# Patient Record
Sex: Female | Born: 1935 | Race: White | Hispanic: No
Health system: Southern US, Community
[De-identification: ages and names within clinical notes are randomized; demographics above are authoritative.]

---

## 2005-10-23 ENCOUNTER — Ambulatory Visit: Payer: Self-pay | Admitting: Unknown Physician Specialty

## 2006-03-21 ENCOUNTER — Ambulatory Visit: Payer: Self-pay | Admitting: Unknown Physician Specialty

## 2006-07-23 ENCOUNTER — Other Ambulatory Visit: Payer: Self-pay

## 2006-07-23 ENCOUNTER — Inpatient Hospital Stay: Payer: Self-pay | Admitting: Internal Medicine

## 2006-09-24 ENCOUNTER — Inpatient Hospital Stay: Payer: Self-pay | Admitting: Unknown Physician Specialty

## 2006-09-29 ENCOUNTER — Encounter: Payer: Self-pay | Admitting: Internal Medicine

## 2006-10-12 ENCOUNTER — Encounter: Payer: Self-pay | Admitting: Internal Medicine

## 2007-01-02 ENCOUNTER — Ambulatory Visit: Payer: Self-pay | Admitting: Unknown Physician Specialty

## 2007-01-28 ENCOUNTER — Ambulatory Visit: Payer: Self-pay | Admitting: Unknown Physician Specialty

## 2007-03-26 ENCOUNTER — Other Ambulatory Visit: Payer: Self-pay

## 2007-03-26 ENCOUNTER — Inpatient Hospital Stay: Payer: Self-pay | Admitting: Internal Medicine

## 2007-08-10 ENCOUNTER — Inpatient Hospital Stay: Payer: Self-pay | Admitting: Specialist

## 2007-08-10 ENCOUNTER — Other Ambulatory Visit: Payer: Self-pay

## 2007-11-12 IMAGING — CT CT HEAD WITHOUT CONTRAST
3 of 4 series · 17 of 30 positions shown, 20 images · non-contrast
Comparison: none

REASON FOR EXAM: Confusion
COMMENTS:  LMP: Post-Menopausal

PROCEDURE:     CT  - CT HEAD WITHOUT CONTRAST  - July 23, 2006  [DATE]
RESULT:     Unenhanced, emergent head CT was performed for confusion.

[Series 2: without · axial · non-contrast · 0.39mm/px · z∈[+262,+352]mm · 9 of 24 slices shown, 12 images (1 of 2)]
[im 3/24  brain]
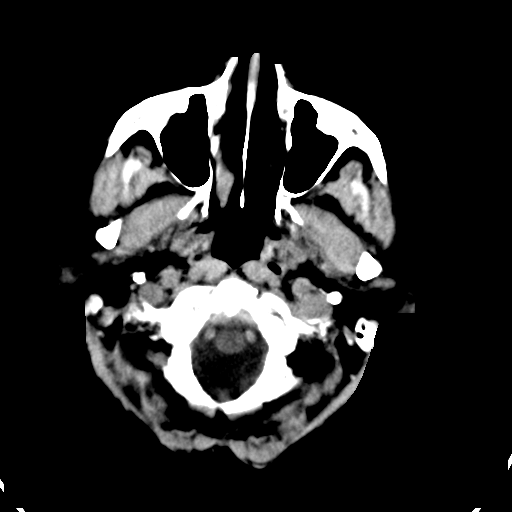
[im 3/24  bone]
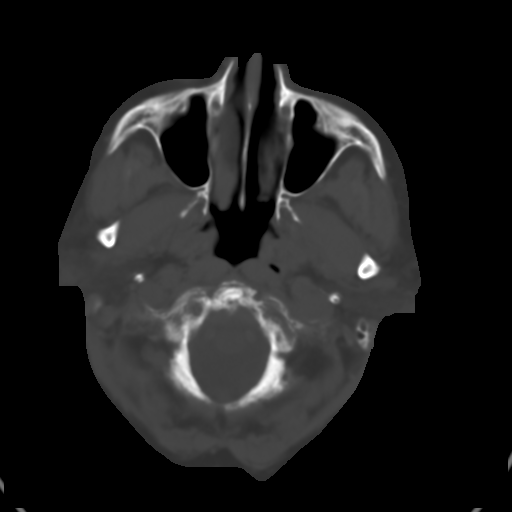
[im 5/24  brain]
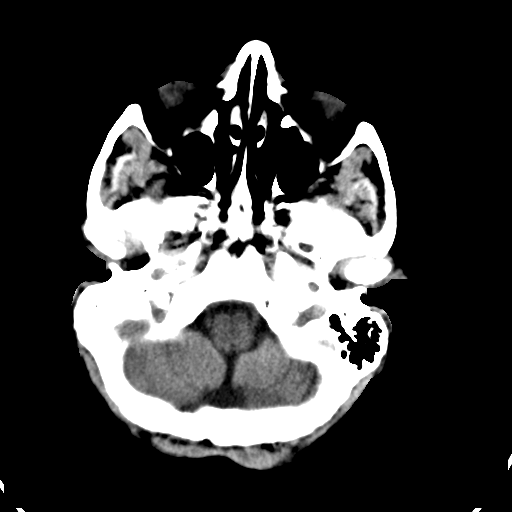
[im 7/24  brain]
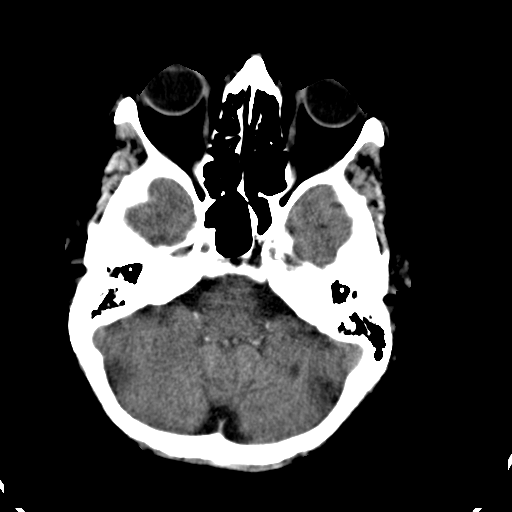
[im 10/24  brain]
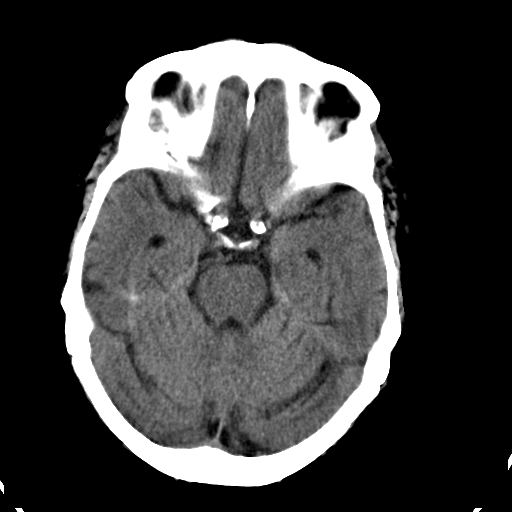
[im 12/24  brain]
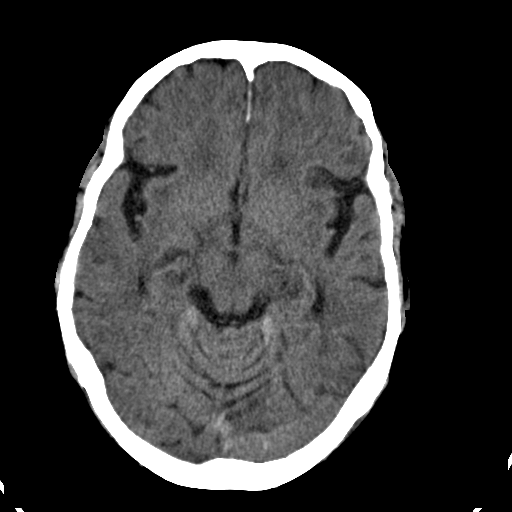
[im 12/24  bone]
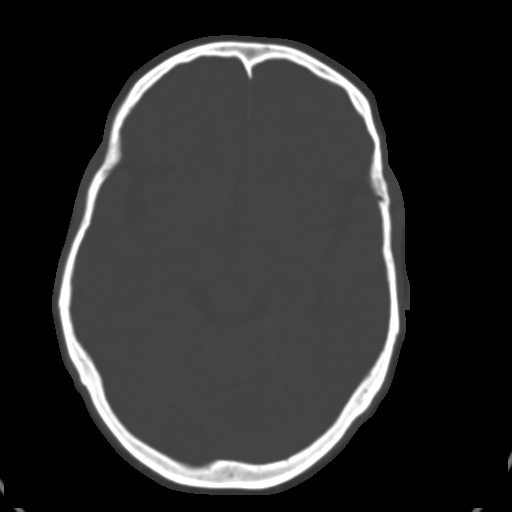
[im 14/24  brain]
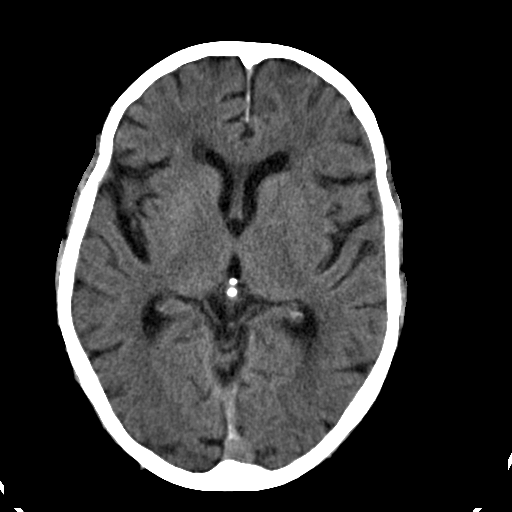
[im 17/24  brain]
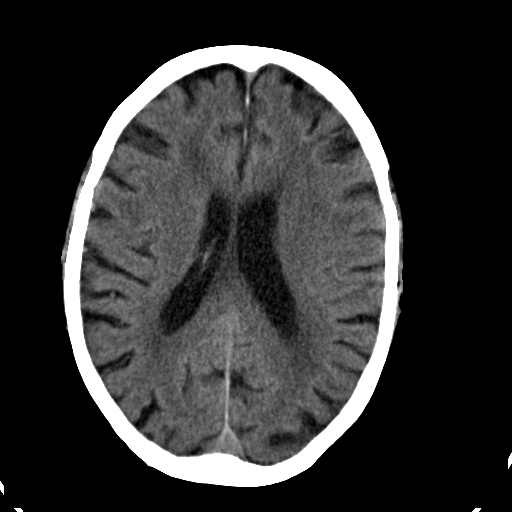
[im 19/24  brain]
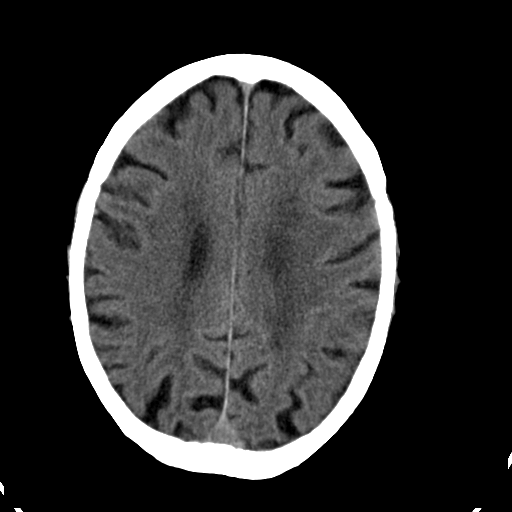
[im 21/24  brain]
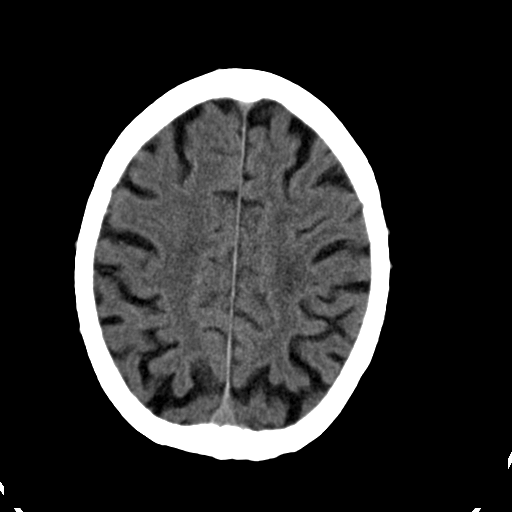
[im 21/24  bone]
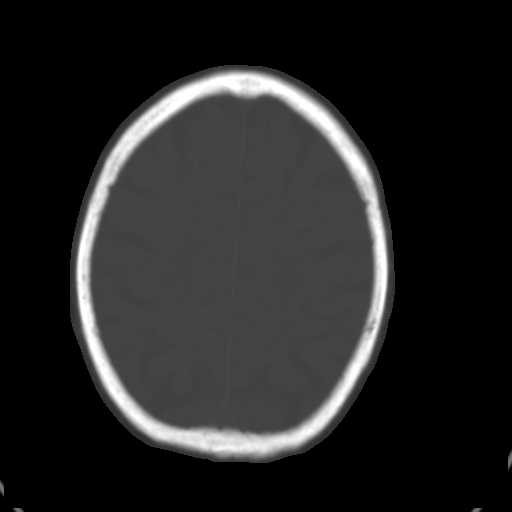

[Series 3: bone · axial · 0.39mm/px · z∈[+262,+327]mm · 6 of 23 slices shown]
[im 3/23  bone]
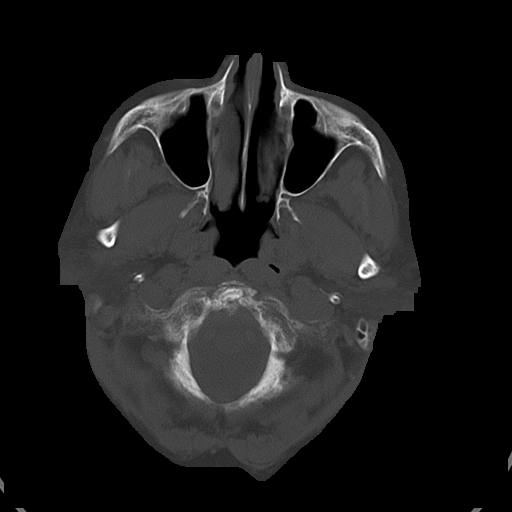
[im 5/23  bone]
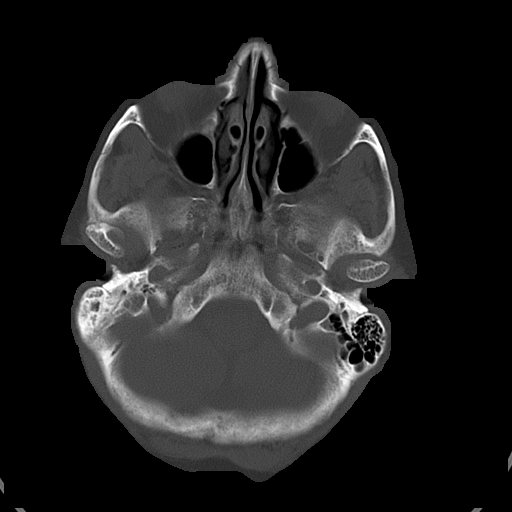
[im 7/23  bone]
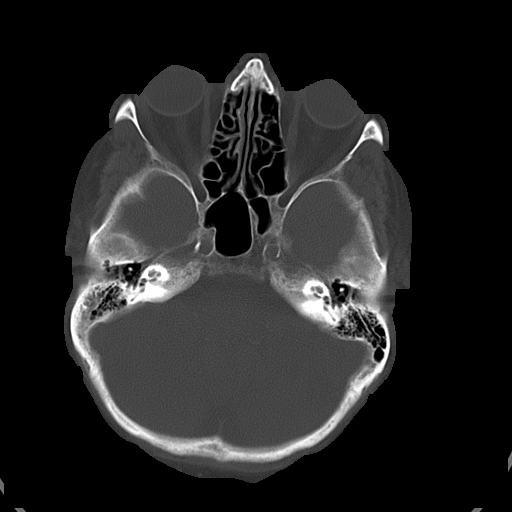
[im 9/23  bone]
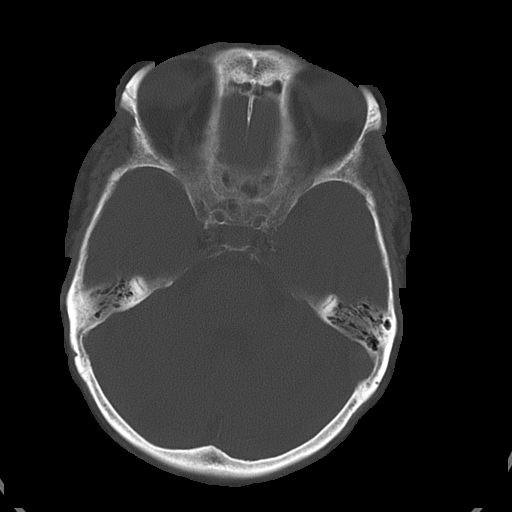
[im 14/23  bone]
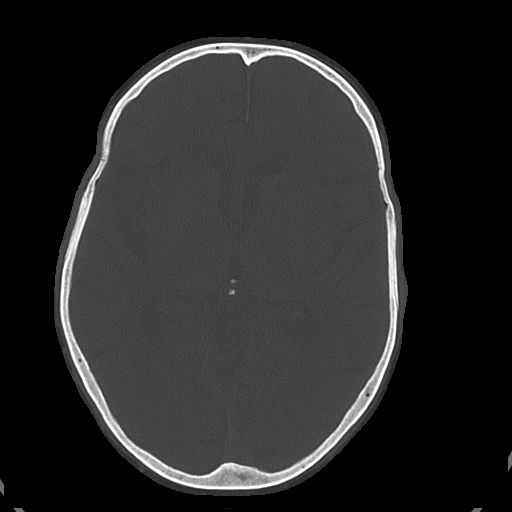
[im 16/23  bone]
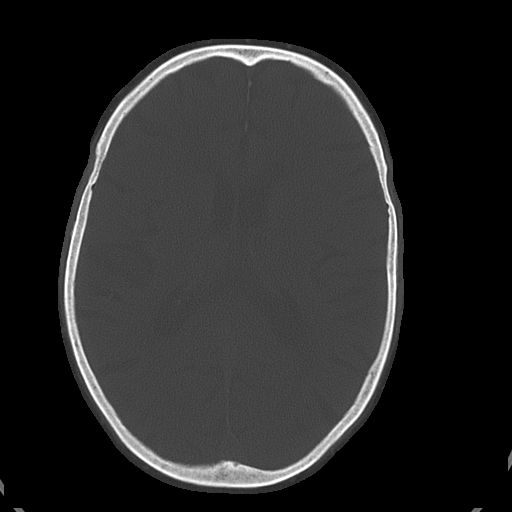

[Series 4: without · axial · non-contrast · 0.39mm/px · z∈[+364,+378]mm · 2 of 9 slices shown (2 of 2)]
[im 3/9  brain]
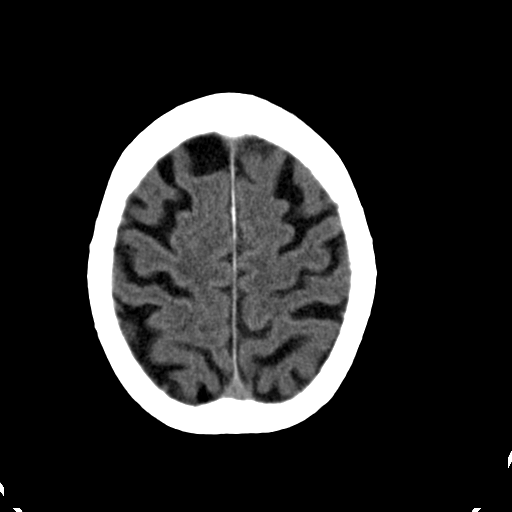
[im 6/9  brain]
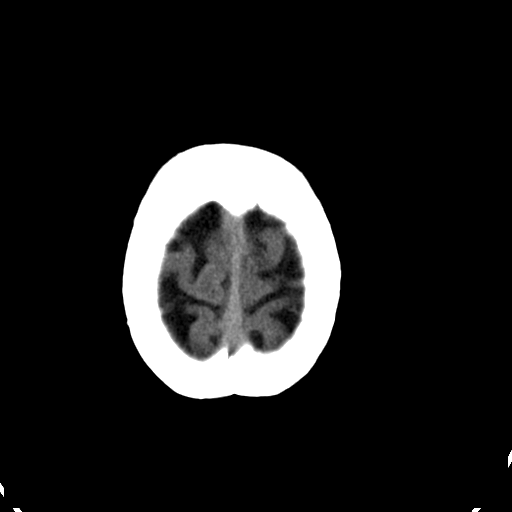

[17 of 30 positions shown; findings below may reference images not displayed]

FINDINGS: There are noted changes of generalized cerebral atrophy with
periventricular small vessel ischemic changes. There is no mass effect or
shift of the midline. No subdural hematomas.

On the bone window settings, no obvious abnormalities are identified. The
sinuses and mastoids appear clear.
IMPRESSION: Changes of atrophy. No acute findings are seen.

The report was called to the Emergency Room at the conclusion of dictation.

## 2008-03-10 ENCOUNTER — Ambulatory Visit: Payer: Self-pay | Admitting: Unknown Physician Specialty

## 2009-06-09 ENCOUNTER — Ambulatory Visit: Payer: Self-pay | Admitting: Unknown Physician Specialty

## 2009-09-05 ENCOUNTER — Ambulatory Visit: Payer: Self-pay | Admitting: Internal Medicine

## 2010-07-13 ENCOUNTER — Ambulatory Visit: Payer: Self-pay | Admitting: Unknown Physician Specialty

## 2010-07-28 ENCOUNTER — Emergency Department: Payer: Self-pay | Admitting: Emergency Medicine

## 2010-08-24 ENCOUNTER — Emergency Department: Payer: Self-pay | Admitting: Emergency Medicine

## 2010-08-25 ENCOUNTER — Inpatient Hospital Stay (HOSPITAL_COMMUNITY): Admission: EM | Admit: 2010-08-25 | Discharge: 2010-08-26 | Payer: Self-pay | Admitting: Emergency Medicine

## 2011-01-25 LAB — GLUCOSE, CAPILLARY: Glucose-Capillary: 305 mg/dL — ABNORMAL HIGH (ref 70–99)

## 2011-08-05 ENCOUNTER — Emergency Department: Payer: Self-pay | Admitting: Emergency Medicine

## 2011-12-23 ENCOUNTER — Emergency Department: Payer: Self-pay | Admitting: Emergency Medicine

## 2011-12-23 LAB — COMPREHENSIVE METABOLIC PANEL
Albumin: 3.7 g/dL (ref 3.4–5.0)
Alkaline Phosphatase: 89 U/L (ref 50–136)
Anion Gap: 7 (ref 7–16)
BUN: 35 mg/dL — ABNORMAL HIGH (ref 7–18)
Glucose: 66 mg/dL (ref 65–99)
Potassium: 3.9 mmol/L (ref 3.5–5.1)
SGOT(AST): 18 U/L (ref 15–37)
Sodium: 141 mmol/L (ref 136–145)
Total Protein: 7.1 g/dL (ref 6.4–8.2)

## 2011-12-23 LAB — CBC
HCT: 34.2 % — ABNORMAL LOW (ref 35.0–47.0)
MCV: 105 fL — ABNORMAL HIGH (ref 80–100)
RDW: 14.4 % (ref 11.5–14.5)
WBC: 11.6 10*3/uL — ABNORMAL HIGH (ref 3.6–11.0)

## 2011-12-23 LAB — TROPONIN I: Troponin-I: <0.02 ng/mL

## 2012-02-06 ENCOUNTER — Emergency Department: Payer: Self-pay | Admitting: *Deleted

## 2012-02-06 LAB — COMPREHENSIVE METABOLIC PANEL
Anion Gap: 11 (ref 7–16)
BUN: 27 mg/dL — ABNORMAL HIGH (ref 7–18)
Bilirubin,Total: 0.3 mg/dL (ref 0.2–1.0)
Calcium, Total: 9.8 mg/dL (ref 8.5–10.1)
Chloride: 110 mmol/L — ABNORMAL HIGH (ref 98–107)
EGFR (African American): >60
Glucose: 70 mg/dL (ref 65–99)
Osmolality: 290 (ref 275–301)
Potassium: 4.9 mmol/L (ref 3.5–5.1)
Sodium: 144 mmol/L (ref 136–145)
Total Protein: 7.5 g/dL (ref 6.4–8.2)

## 2012-02-06 LAB — URINALYSIS, COMPLETE
Nitrite: POSITIVE
Squamous Epithelial: <1
WBC UR: 342 /HPF (ref 0–5)

## 2012-02-06 LAB — TROPONIN I: Troponin-I: <0.02 ng/mL

## 2012-02-06 LAB — CBC
MCH: 34.6 pg — ABNORMAL HIGH (ref 26.0–34.0)
MCHC: 32.7 g/dL (ref 32.0–36.0)
RBC: 3.24 10*6/uL — ABNORMAL LOW (ref 3.80–5.20)
RDW: 13.8 % (ref 11.5–14.5)

## 2012-02-06 LAB — LIPASE, BLOOD: Lipase: 107 U/L (ref 73–393)

## 2012-02-08 LAB — URINE CULTURE

## 2012-04-15 DIAGNOSIS — I10 Essential (primary) hypertension: Secondary | ICD-10-CM

## 2012-04-15 DIAGNOSIS — F0390 Unspecified dementia without behavioral disturbance: Secondary | ICD-10-CM

## 2012-04-15 DIAGNOSIS — I5042 Chronic combined systolic (congestive) and diastolic (congestive) heart failure: Secondary | ICD-10-CM

## 2012-04-15 DIAGNOSIS — IMO0001 Reserved for inherently not codable concepts without codable children: Secondary | ICD-10-CM

## 2012-04-15 DIAGNOSIS — E1165 Type 2 diabetes mellitus with hyperglycemia: Secondary | ICD-10-CM

## 2012-05-02 DIAGNOSIS — E1165 Type 2 diabetes mellitus with hyperglycemia: Secondary | ICD-10-CM

## 2012-05-21 DIAGNOSIS — I10 Essential (primary) hypertension: Secondary | ICD-10-CM

## 2012-05-21 DIAGNOSIS — E1165 Type 2 diabetes mellitus with hyperglycemia: Secondary | ICD-10-CM

## 2012-05-21 DIAGNOSIS — F068 Other specified mental disorders due to known physiological condition: Secondary | ICD-10-CM

## 2012-05-21 DIAGNOSIS — J449 Chronic obstructive pulmonary disease, unspecified: Secondary | ICD-10-CM

## 2012-05-27 DIAGNOSIS — F411 Generalized anxiety disorder: Secondary | ICD-10-CM

## 2012-06-27 DIAGNOSIS — I509 Heart failure, unspecified: Secondary | ICD-10-CM

## 2012-07-15 ENCOUNTER — Inpatient Hospital Stay: Payer: Self-pay | Admitting: Specialist

## 2012-07-15 ENCOUNTER — Telehealth: Payer: Self-pay | Admitting: Internal Medicine

## 2012-07-15 LAB — COMPREHENSIVE METABOLIC PANEL
Albumin: 3.4 g/dL (ref 3.4–5.0)
Alkaline Phosphatase: 121 U/L (ref 50–136)
BUN: 31 mg/dL — ABNORMAL HIGH (ref 7–18)
Bilirubin,Total: 0.4 mg/dL (ref 0.2–1.0)
Calcium, Total: 9.5 mg/dL (ref 8.5–10.1)
Creatinine: 1.02 mg/dL (ref 0.60–1.30)
Glucose: 168 mg/dL — ABNORMAL HIGH (ref 65–99)
Potassium: 4.5 mmol/L (ref 3.5–5.1)
Sodium: 140 mmol/L (ref 136–145)
Total Protein: 7.1 g/dL (ref 6.4–8.2)

## 2012-07-15 LAB — CBC WITH DIFFERENTIAL/PLATELET
Eosinophil %: 0.6 %
Lymphocyte #: 0.9 10*3/uL — ABNORMAL LOW (ref 1.0–3.6)
Lymphocyte %: 6 %
Monocyte %: 9.5 %
Neutrophil %: 83.7 %
Platelet: 210 10*3/uL (ref 150–440)
RBC: 2.91 10*6/uL — ABNORMAL LOW (ref 3.80–5.20)
RDW: 15.6 % — ABNORMAL HIGH (ref 11.5–14.5)
WBC: 15 10*3/uL — ABNORMAL HIGH (ref 3.6–11.0)

## 2012-07-15 LAB — URINALYSIS, COMPLETE
Bilirubin,UR: NEGATIVE
Ketone: NEGATIVE
Protein: NEGATIVE
RBC,UR: 4 /HPF (ref 0–5)
WBC UR: 324 /HPF (ref 0–5)

## 2012-07-15 NOTE — Telephone Encounter (Signed)
Will review status when I am at [facility] this afternoon

## 2012-07-15 NOTE — Telephone Encounter (Signed)
Triage Record Num: [id number] Operator: Edgar Frisk Patient Name: Lisa Horn Call Date & Time: 07/15/2012 4:41:11AM Patient Phone: [phone] PCP: Tillman Abide Patient Gender: Female PCP Fax : [phone] Patient DOB: [birth year] [facility] Name[facility] Reason for Call: Caller: Elizabeth/RN; PCP: Tillman Abide (Family Practice); CB#: [phone]; Call regarding SOB; Nurse reports pt had sudden onset SOB, Resp labored. Temp 99 Ax. Color dusky. O2 sat 83% Advised 911 now Protocol(s) Used: Breathing Problems Recommended Outcome per Protocol: Activate EMS 911 Reason for Outcome: Sudden onset of severe breathing difficulty Care Advice: Write down provider's name. List or place the following in a bag for transport with the patient: current prescription and/or nonprescription medications; alternative treatments, therapies and medications; and street drugs. ~ 07/15/2012 4:48:31AM Page 1 of 1 CAN_TriageRpt_V2

## 2012-07-16 LAB — COMPREHENSIVE METABOLIC PANEL
Anion Gap: 6 — ABNORMAL LOW (ref 7–16)
BUN: 38 mg/dL — ABNORMAL HIGH (ref 7–18)
Bilirubin,Total: 0.5 mg/dL (ref 0.2–1.0)
Chloride: 108 mmol/L — ABNORMAL HIGH (ref 98–107)
Co2: 28 mmol/L (ref 21–32)
Creatinine: 1.17 mg/dL (ref 0.60–1.30)
EGFR (African American): 48 — ABNORMAL LOW
EGFR (Non-African Amer.): 41 — ABNORMAL LOW
Potassium: 4.7 mmol/L (ref 3.5–5.1)
SGPT (ALT): 26 U/L (ref 12–78)
Total Protein: 6.4 g/dL (ref 6.4–8.2)

## 2012-07-16 LAB — CBC WITH DIFFERENTIAL/PLATELET
Basophil %: 0.4 %
HCT: 28.8 % — ABNORMAL LOW (ref 35.0–47.0)
HGB: 9.2 g/dL — ABNORMAL LOW (ref 12.0–16.0)
Lymphocyte %: 7.4 %
MCHC: 32 g/dL (ref 32.0–36.0)
Monocyte %: 8.4 %
Neutrophil #: 12.9 10*3/uL — ABNORMAL HIGH (ref 1.4–6.5)
WBC: 15.3 10*3/uL — ABNORMAL HIGH (ref 3.6–11.0)

## 2012-07-18 DIAGNOSIS — I10 Essential (primary) hypertension: Secondary | ICD-10-CM

## 2012-07-18 DIAGNOSIS — R4182 Altered mental status, unspecified: Secondary | ICD-10-CM

## 2012-07-18 DIAGNOSIS — E1165 Type 2 diabetes mellitus with hyperglycemia: Secondary | ICD-10-CM

## 2012-07-18 DIAGNOSIS — I509 Heart failure, unspecified: Secondary | ICD-10-CM

## 2012-08-18 DIAGNOSIS — F411 Generalized anxiety disorder: Secondary | ICD-10-CM

## 2012-08-18 DIAGNOSIS — E119 Type 2 diabetes mellitus without complications: Secondary | ICD-10-CM

## 2012-08-18 DIAGNOSIS — G479 Sleep disorder, unspecified: Secondary | ICD-10-CM

## 2012-08-18 DIAGNOSIS — F068 Other specified mental disorders due to known physiological condition: Secondary | ICD-10-CM

## 2012-08-19 DIAGNOSIS — L0291 Cutaneous abscess, unspecified: Secondary | ICD-10-CM

## 2012-08-19 DIAGNOSIS — L0390 Cellulitis, unspecified: Secondary | ICD-10-CM

## 2012-08-20 ENCOUNTER — Ambulatory Visit: Payer: Self-pay | Admitting: Internal Medicine

## 2012-09-03 DIAGNOSIS — J449 Chronic obstructive pulmonary disease, unspecified: Secondary | ICD-10-CM

## 2012-09-19 DIAGNOSIS — E1165 Type 2 diabetes mellitus with hyperglycemia: Secondary | ICD-10-CM

## 2012-09-19 DIAGNOSIS — J449 Chronic obstructive pulmonary disease, unspecified: Secondary | ICD-10-CM

## 2012-09-19 DIAGNOSIS — I509 Heart failure, unspecified: Secondary | ICD-10-CM

## 2012-09-19 DIAGNOSIS — F068 Other specified mental disorders due to known physiological condition: Secondary | ICD-10-CM

## 2012-11-24 DIAGNOSIS — E119 Type 2 diabetes mellitus without complications: Secondary | ICD-10-CM

## 2012-11-24 DIAGNOSIS — F411 Generalized anxiety disorder: Secondary | ICD-10-CM

## 2012-11-24 DIAGNOSIS — I10 Essential (primary) hypertension: Secondary | ICD-10-CM

## 2012-11-24 DIAGNOSIS — F068 Other specified mental disorders due to known physiological condition: Secondary | ICD-10-CM

## 2013-01-13 DIAGNOSIS — F411 Generalized anxiety disorder: Secondary | ICD-10-CM

## 2013-01-13 DIAGNOSIS — F068 Other specified mental disorders due to known physiological condition: Secondary | ICD-10-CM

## 2013-01-13 DIAGNOSIS — E119 Type 2 diabetes mellitus without complications: Secondary | ICD-10-CM

## 2013-01-13 DIAGNOSIS — I5022 Chronic systolic (congestive) heart failure: Secondary | ICD-10-CM

## 2013-03-13 DIAGNOSIS — K219 Gastro-esophageal reflux disease without esophagitis: Secondary | ICD-10-CM

## 2013-03-13 DIAGNOSIS — F068 Other specified mental disorders due to known physiological condition: Secondary | ICD-10-CM

## 2013-03-13 DIAGNOSIS — I509 Heart failure, unspecified: Secondary | ICD-10-CM

## 2013-03-13 DIAGNOSIS — E1165 Type 2 diabetes mellitus with hyperglycemia: Secondary | ICD-10-CM

## 2013-03-24 ENCOUNTER — Telehealth: Payer: Self-pay | Admitting: Family Medicine

## 2013-03-24 ENCOUNTER — Inpatient Hospital Stay: Payer: Self-pay | Admitting: Orthopedic Surgery

## 2013-03-24 LAB — CBC WITH DIFFERENTIAL/PLATELET
Basophil #: 0.1 x10 3/mm 3
Basophil %: 0.9 %
Eosinophil #: 0.1 x10 3/mm 3
Eosinophil %: 0.8 %
HCT: 35 %
HGB: 11.8 g/dL — ABNORMAL LOW
Lymphocyte %: 10.8 %
Lymphs Abs: 1.8 x10 3/mm 3
MCH: 34.8 pg — ABNORMAL HIGH
MCHC: 33.7 g/dL
MCV: 103 fL — ABNORMAL HIGH
Monocyte #: 1.4 "x10 3/mm " — ABNORMAL HIGH
Monocyte %: 8.3 %
Neutrophil #: 12.9 x10 3/mm 3 — ABNORMAL HIGH
Neutrophil %: 79.2 %
Platelet: 219 x10 3/mm 3
RBC: 3.4 X10 6/mm 3 — ABNORMAL LOW
RDW: 13.8 %
WBC: 16.3 x10 3/mm 3 — ABNORMAL HIGH

## 2013-03-24 LAB — BASIC METABOLIC PANEL: Glucose: 175 mg/dL — ABNORMAL HIGH (ref 65–99)

## 2013-03-24 NOTE — Telephone Encounter (Signed)
Will check on her status at [facility]

## 2013-03-24 NOTE — Telephone Encounter (Signed)
[facility] Triage Call Report Triage Record Num: [id number] Operator: Migdalia Dk Patient Name: Lisa Horn Call Date & Time: 03/24/2013 2:46:49AM Patient Phone: [phone] PCP: Tillman Abide Patient Gender: Female PCP Fax : [phone] Patient DOB: 11/13/1919 [facility] Reason for Call: Caller: Elizabeth/RN; PCP: Tillman Abide (Family Practice); CB#: [phone]; Nurse states patient found on floor. C/o severe left hip pain, left leg externally rotated and unable to move leg without severe pain. Advised to call 911 and send patient to ED. Protocol(s) Used: Hip Injury Recommended Outcome per Protocol: Activate EMS 911 Reason for Outcome: Unbearable pain Care Advice: ~ 03/24/2013 2:50:21AM Page 1 of 1 CAN_TriageRpt_V2

## 2013-03-25 LAB — URINALYSIS, COMPLETE
Bacteria: NONE SEEN
Hyaline Cast: 4
Leukocyte Esterase: NEGATIVE
Ph: 7 (ref 4.5–8.0)

## 2013-03-25 LAB — BASIC METABOLIC PANEL
Anion Gap: 7 (ref 7–16)
BUN: 28 mg/dL — ABNORMAL HIGH (ref 7–18)
Calcium, Total: 9.4 mg/dL (ref 8.5–10.1)
Chloride: 105 mmol/L (ref 98–107)
Creatinine: 1.13 mg/dL (ref 0.60–1.30)
EGFR (Non-African Amer.): 43 — ABNORMAL LOW
Potassium: 3.8 mmol/L (ref 3.5–5.1)
Sodium: 140 mmol/L (ref 136–145)

## 2013-03-25 LAB — CBC WITH DIFFERENTIAL/PLATELET
Basophil #: 0 10*3/uL (ref 0.0–0.1)
Eosinophil #: 0 10*3/uL (ref 0.0–0.7)
HCT: 25.2 % — ABNORMAL LOW (ref 35.0–47.0)
HGB: 8.5 g/dL — ABNORMAL LOW (ref 12.0–16.0)
MCH: 34.9 pg — ABNORMAL HIGH (ref 26.0–34.0)
Monocyte #: 2.2 x10 3/mm — ABNORMAL HIGH (ref 0.2–0.9)
Monocyte %: 14.5 %
Neutrophil #: 11.3 10*3/uL — ABNORMAL HIGH (ref 1.4–6.5)
Neutrophil %: 74 %
Platelet: 149 10*3/uL — ABNORMAL LOW (ref 150–440)

## 2013-03-26 LAB — CBC WITH DIFFERENTIAL/PLATELET
Basophil #: 0 10*3/uL (ref 0.0–0.1)
Eosinophil %: 0.2 %
HCT: 23.4 % — ABNORMAL LOW (ref 35.0–47.0)
Lymphocyte #: 3.5 10*3/uL (ref 1.0–3.6)
Lymphocyte %: 20.7 %
Monocyte %: 14 %
Platelet: 139 10*3/uL — ABNORMAL LOW (ref 150–440)
RBC: 2.27 10*6/uL — ABNORMAL LOW (ref 3.80–5.20)

## 2013-03-26 LAB — HEMOGLOBIN: HGB: 8.8 g/dL — ABNORMAL LOW (ref 12.0–16.0)

## 2013-03-27 LAB — CBC WITH DIFFERENTIAL/PLATELET
Basophil #: 0 10*3/uL (ref 0.0–0.1)
Basophil %: 0.2 %
Eosinophil #: 0.1 10*3/uL (ref 0.0–0.7)
Eosinophil %: 0.6 %
HCT: 24 % — ABNORMAL LOW (ref 35.0–47.0)
HGB: 8.4 g/dL — ABNORMAL LOW (ref 12.0–16.0)
MCHC: 34.9 g/dL (ref 32.0–36.0)
MCV: 100 fL (ref 80–100)
Monocyte #: 2.7 x10 3/mm — ABNORMAL HIGH (ref 0.2–0.9)
Neutrophil #: 13 10*3/uL — ABNORMAL HIGH (ref 1.4–6.5)

## 2013-03-27 LAB — BASIC METABOLIC PANEL
Anion Gap: 9 (ref 7–16)
BUN: 31 mg/dL — ABNORMAL HIGH (ref 7–18)
Co2: 25 mmol/L (ref 21–32)
Creatinine: 1.04 mg/dL (ref 0.60–1.30)
EGFR (African American): 55 — ABNORMAL LOW
EGFR (Non-African Amer.): 47 — ABNORMAL LOW
Glucose: 234 mg/dL — ABNORMAL HIGH (ref 65–99)
Osmolality: 297 (ref 275–301)
Potassium: 3.5 mmol/L (ref 3.5–5.1)
Sodium: 142 mmol/L (ref 136–145)

## 2013-03-30 DIAGNOSIS — I5022 Chronic systolic (congestive) heart failure: Secondary | ICD-10-CM

## 2013-03-30 DIAGNOSIS — S7290XA Unspecified fracture of unspecified femur, initial encounter for closed fracture: Secondary | ICD-10-CM

## 2013-05-11 ENCOUNTER — Telehealth: Payer: Self-pay | Admitting: Family Medicine

## 2013-05-11 DIAGNOSIS — R0902 Hypoxemia: Secondary | ICD-10-CM

## 2013-05-11 NOTE — Telephone Encounter (Signed)
[facility] Triage Call Report Triage Record Num: [id number] Operator: Donnella Sham Patient Name: Lisa Horn Call Date & Time: 05/10/2013 11:44:36AM Patient Phone: [phone] PCP: Tillman Abide Patient Gender: Female PCP Fax : [phone] Patient DOB: [birth year] Practice [facility] Reason for Call: Caller: Tammy/RN; PCP: Tillman Abide (Family Practice); CB#: [phone]; Call regarding BS 160 with tremors; said she was agitated and Xanax was given; sats were in the 70s because she kept taking off her O2; says she is now fine; sats 97%; will leave a note for Dr.Letvak Protocol(s) Used: Office Note Recommended Outcome per Protocol: Information Noted and Sent to Office Reason for Outcome: Caller information to office Care Advice: ~ 05/10/2013 11:54:46AM Page 1 of 1 CAN_TriageRpt_V2

## 2013-05-11 NOTE — Telephone Encounter (Signed)
Seen today and discussed with her daughter

## 2013-05-20 DIAGNOSIS — I2510 Atherosclerotic heart disease of native coronary artery without angina pectoris: Secondary | ICD-10-CM

## 2013-05-20 DIAGNOSIS — I509 Heart failure, unspecified: Secondary | ICD-10-CM

## 2013-05-20 DIAGNOSIS — E119 Type 2 diabetes mellitus without complications: Secondary | ICD-10-CM

## 2013-05-20 DIAGNOSIS — E785 Hyperlipidemia, unspecified: Secondary | ICD-10-CM

## 2013-05-20 DIAGNOSIS — I10 Essential (primary) hypertension: Secondary | ICD-10-CM

## 2013-06-08 DIAGNOSIS — L0211 Cutaneous abscess of neck: Secondary | ICD-10-CM

## 2013-06-08 DIAGNOSIS — R634 Abnormal weight loss: Secondary | ICD-10-CM

## 2013-06-08 DIAGNOSIS — I2510 Atherosclerotic heart disease of native coronary artery without angina pectoris: Secondary | ICD-10-CM

## 2013-06-08 DIAGNOSIS — I509 Heart failure, unspecified: Secondary | ICD-10-CM

## 2013-06-08 DIAGNOSIS — F0150 Vascular dementia without behavioral disturbance: Secondary | ICD-10-CM

## 2013-06-08 DIAGNOSIS — L03221 Cellulitis of neck: Secondary | ICD-10-CM

## 2013-06-12 DEATH — deceased

## 2014-07-14 IMAGING — CR DG C-ARM 1-60 MIN
3 series · 3 of 3 positions shown · non-contrast
Comparison: none

REASON FOR EXAM: Fracture Reduction/Fixation
COMMENTS:   LMP: Post-Menopausal

[distal]
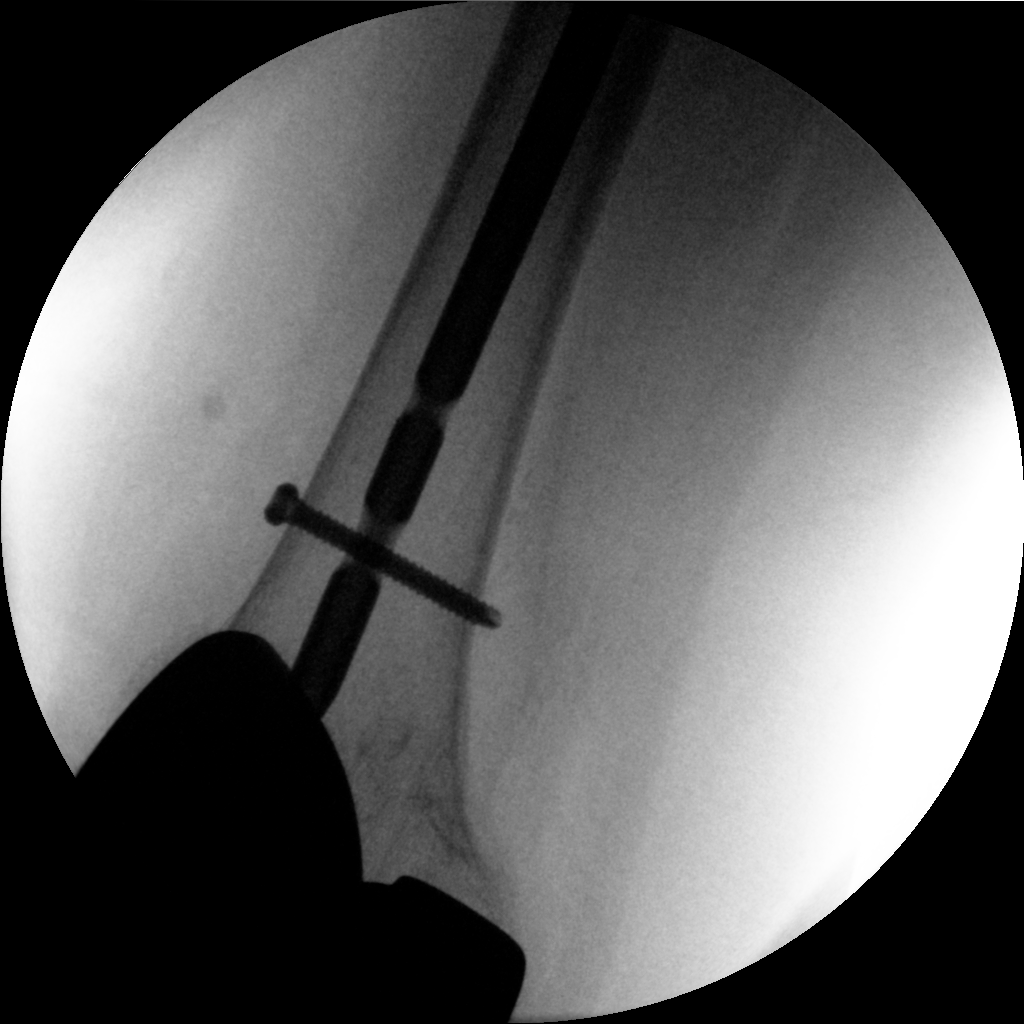

[ap]
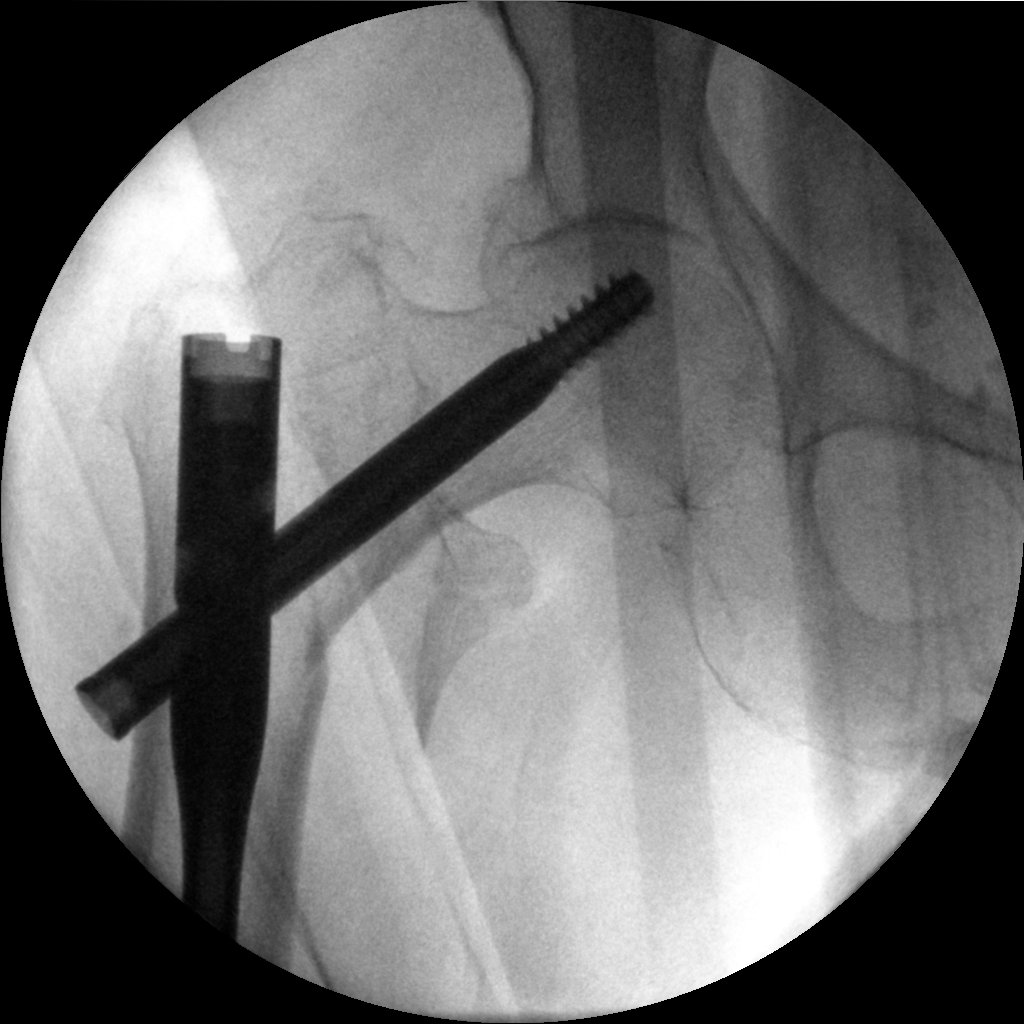

[lateral]
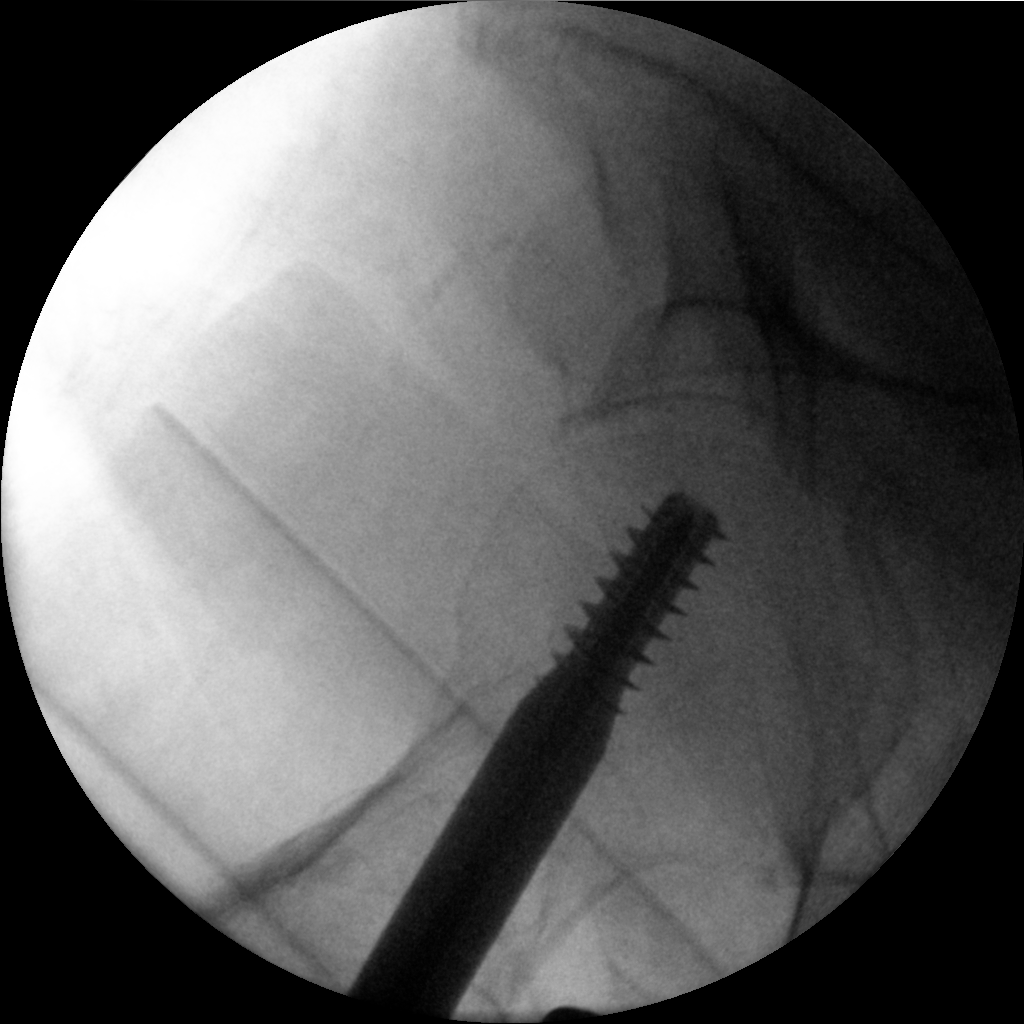

[3 of 3 positions shown; findings below may reference images not displayed]

PROCEDURE:     DXR - DXR C-ARM WITH 2 VIEWS RT HIP  - March 24, 2013  [DATE]

RESULT:     Three spot films from the operating room reveal the patient to
have undergone ORIF for an intertrochanteric fracture. An intramedullary rod
with telescoping screw is present. Alignment of the fracture fragments is
now more nearly anatomic.
IMPRESSION: The patient has undergone ORIF for an intertrochanteric
fracture of the right hip. Further interpretation is deferred to Dr. Felner.

[REDACTED]

## 2014-07-14 IMAGING — CR DG CHEST 1V
1 series · 1 of 1 positions shown · non-contrast
Comparison: none

REASON FOR EXAM: fall
COMMENTS:

[t chest supine]
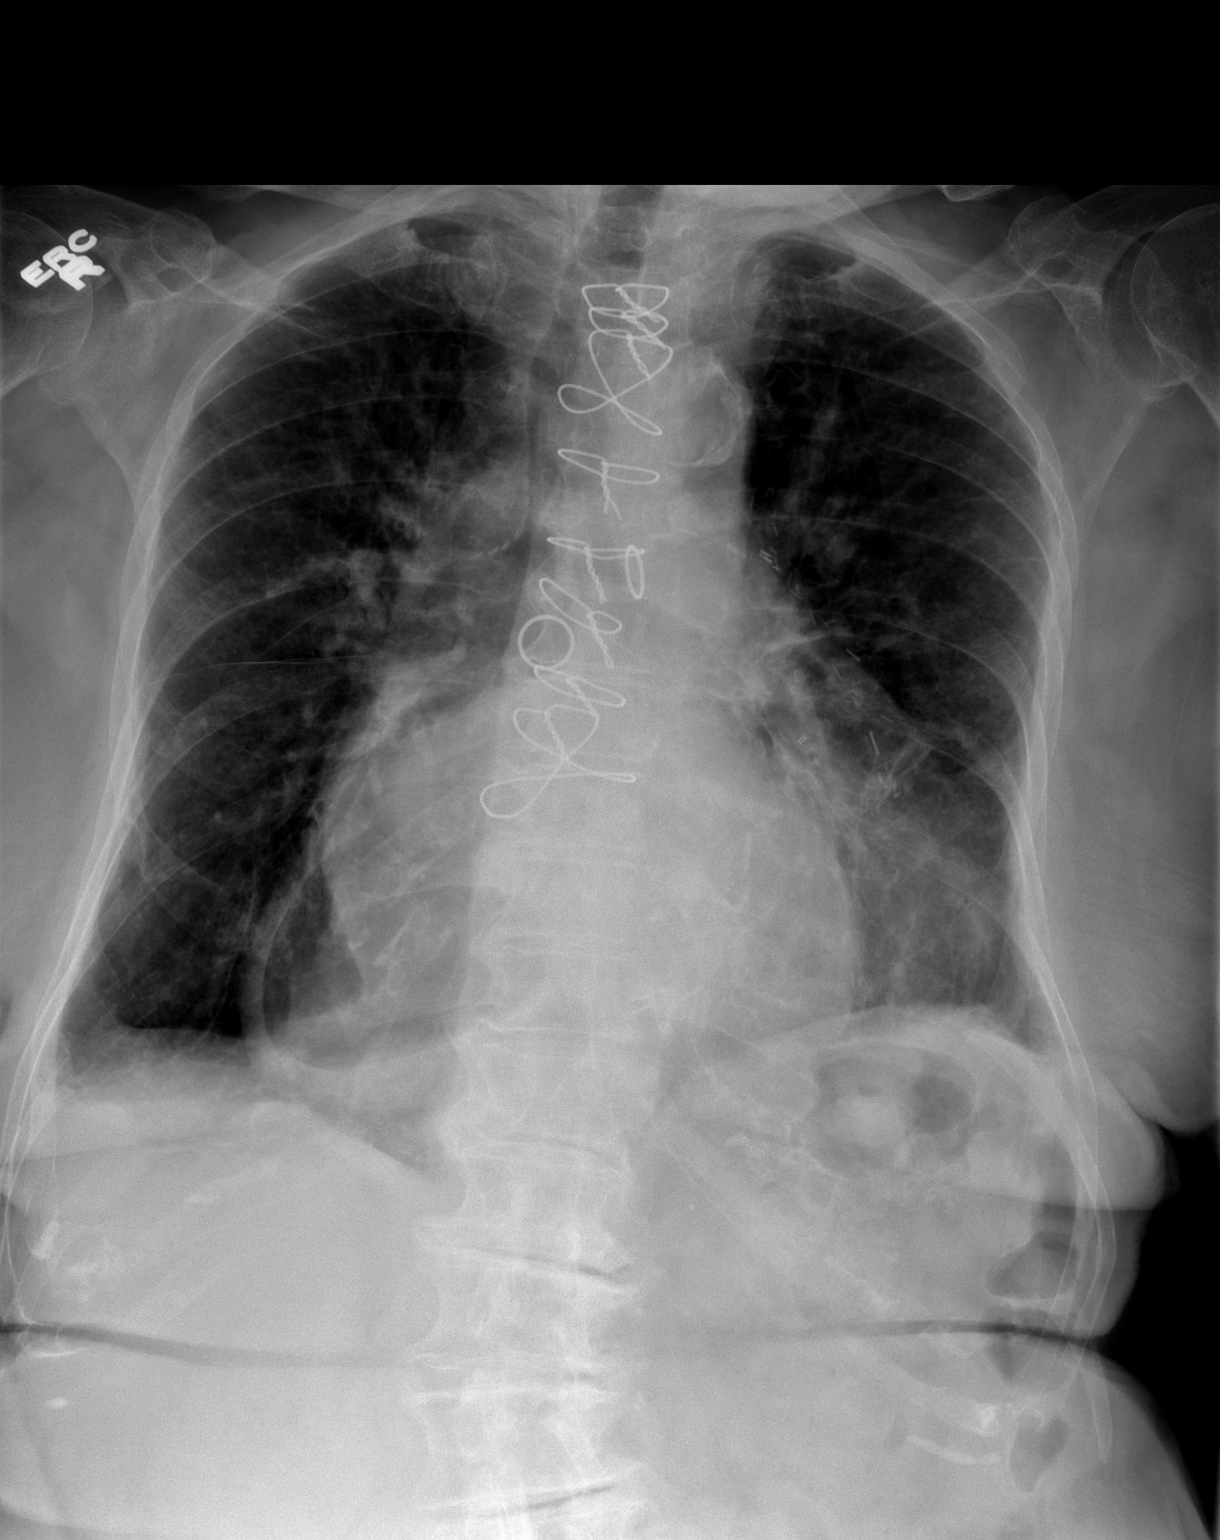

[1 of 1 positions shown; findings below may reference images not displayed]

PROCEDURE:     DXR - DXR CHEST 1 VIEWAP OR PA  - March 24, 2013  [DATE]

RESULT:     Comparison is made to the previous chest x-ray from 15 July, 2012. There are CABG changes present. There is significant cardiomegaly. Is
diffuse interstitial prominence with pulmonary vascular congestion. There is
no significant consolidation, effusion or pneumothorax. There is an old
healed mid shaft left clavicular fracture. Followup PA and lateral views
would be helpful.
IMPRESSION: Cardiomegaly with CABG changes superimposed on likely
underlying chronic interstitial lung disease and possible diffuse
interstitial edema or nonedematous infiltrate. There is underlying
osteopenia without definite acute bony abnormality or pneumothorax.

[REDACTED]

## 2015-03-01 NOTE — Discharge Summary (Signed)
PATIENT NAME:  Lisa Horn, NIPPERT MR#:  [id number] DATE OF BIRTH:  1919/12/24  DATE OF ADMISSION:  07/15/2012 DATE OF DISCHARGE:  07/16/2012  HISTORY AND PHYSICAL: For a detailed note, please take a look at the History and Physical done on admission by Dr. Huey Bienenstock.   DISCHARGE DIAGNOSES:  1. Altered mental status likely secondary to urinary tract infection.  2. Urinary tract infection.  3. History of underlying dementia.  4. Hypertension.  5. Diabetes.  6. Gastroesophageal reflux disease.  7. Hyperlipidemia.   DIET: The patient is being discharged on an American Diabetic Association low sodium, low fat diet.   ACTIVITY: As tolerated.   FOLLOWUP: Follow up with Dr. Alphonsus Sias at the skilled nursing facility in the next 1 to 2 weeks.   CODE STATUS: The patient is a DO NOT INTUBATE/DO NOT RESUSCITATE.    DISCHARGE MEDICATIONS:  1. Coreg 6.25 mg b.i.d.  2. Diovan 325 mg daily.  3. Celexa 20 mg daily.  4. Simvastatin 40 mg at bedtime.  5. Tylenol 650 mg b.i.d. as needed.   6. Glucophage 500 mg b.i.d.  7. Humalog sliding scale for fingersticks greater than 300.  8. Bayer aspirin 81 mg daily.  9. Vitamin B12 1000 mcg intramuscularly monthly.  10. Potassium chloride 10 mEq, 1 tab daily.  11. Lasix 20 mg daily.  12. Lantus 25 units daily.  13. Miconazole b.i.d. as needed.  14. Xanax 0.5 mg q.i.d. as needed.  15. Trazodone 25 mg at bedtime.  16. Ceftin 5 mg b.i.d. x6 days.   PERTINENT STUDIES DURING HOSPITAL COURSE: A chest x-ray done on admission showed cardiomegaly with pulmonary edema and large hiatal hernia. Blood cultures are essentially negative. Abnormal urinalysis.   BRIEF HOSPITAL COURSE: The patient is a 81 year old female with medical problems as mentioned above, presented to the hospital on 07/15/2012 secondary to some fever, shortness of breath, cough.   1. Altered mental status: The patient presented to the hospital with some low-grade fevers and mental status  change and weakness, and also was noted to have a cough and shortness of breath. It was initially thought that the patient may have underlying pneumonia, therefore, was started on ceftriaxone and Zithromax, although a chest x-ray did not show any evidence of acute pneumonia. Her urinalysis was grossly abnormal consistent with a urinary tract infection. After treatment with IV antibiotics, the patient's mental status has now come back to baseline. She clinically does not complain of a cough, is afebrile. She still has a slightly elevated white cell count which can likely be further followed as an outpatient.  2. Urinary tract infection: Likely the cause of patient's mental status change. As mentioned, she is being treated with ceftriaxone here in the hospital. She is being discharged on p.o. Ceftin for the next 6 days.  3. Hypertension: The patient remained hemodynamically stable in the hospital. She will resume her Diovan and Coreg upon discharge. Those were held for the first day as she was altered and not taking any p.o.  4. Diabetes: The patient will continue her Glucophage, and Lantus, and sliding scale coverage as she was maintained on that.  5. Hyperlipidemia: The patient was maintained on simvastatin. She will resume that. Anxiety/depression: The patient will continue with Xanax and Celexa as mentioned.   CODE STATUS: Patient is a DNR/DNI.   She is being discharged back to a skilled nursing facility. The plan was discussed with the patient's family, and they are in agreement.   TIME SPENT: 40  minutes.  ____________________________ Rolly PancakeVivek J. Cherlynn KaiserSainani, MD vjs:cbb D: 07/16/2012 12:40:39 ET T: 07/16/2012 12:51:17 ET JOB#: [id number]  cc: Rolly PancakeVivek J. Cherlynn KaiserSainani, MD, <Dictator> Karie Schwalbeichard I. Letvak, MD [place] SirenVIVEK J SAINANI MD ELECTRONICALLY SIGNED 07/16/2012 15:57

## 2015-03-01 NOTE — H&P (Signed)
PATIENT NAME:  Lisa Horn, Lisa Horn MR#:  [id number] DATE OF BIRTH:  07/27/1920  DATE OF ADMISSION:  07/15/2012  REFERRING PHYSICIAN: Dr. Marilynne Halsted    PRIMARY CARE PHYSICIAN: Dr. Alphonsus Sias    CHIEF COMPLAINT: Fever, shortness of breath, cough.   HISTORY OF PRESENT ILLNESS: This is a 81 year old female who has been living for the last three months at [facility] nursing home who was brought in for complaints of cough, shortness of breath, and fever. The patient was febrile at 101. Upon presentation the patient was hypoxic requiring oxygen via nasal cannula, as well was lethargic and confused. The patient had basic work-up done in the ED where she did have chest x-ray which did show evidence of left lower lung infiltrate as well some haziness in the right lung, unclear if it was due to vascular congestion or infiltrate as well. The patient is an extremely poor historian with history of dementia and could not give much history. Most of the history and HPI was obtained from the daughter at bedside and the ED staff. Daughter reports her mother has been in the nursing home for the last three months where she has significant deconditioning and weakness where she only ambulates occasionally with a wheelchair but she reports her mom has good appetite and she has baseline dementia. Blood cultures were sent. The patient was started on Rocephin and Zithromax in the ED. As mentioned above, unfortunately, the patient cannot give any reliable history. Answer to most of the questions is "I do not know".   PAST MEDICAL HISTORY:  1. Diabetes mellitus, type II.  2. Coronary artery disease, status post CABG.  3. Congestive heart failure.  4. History of pulmonary embolism after bypass surgery.  5. Hypertension.  6. History of MI in 1998.  7. Chronic obstructive pulmonary disease.  8. Osteoarthritis.  9. Mild dementia.  10. History of anemia and thrombocytopenia requiring splenectomy.  11. Hyperlipidemia.   PAST SURGICAL  HISTORY:  1. Coronary artery bypass graft.  2. Left knee replacement.  3. Cholecystectomy.  4. Partial hysterectomy.  5. Splenectomy.   ALLERGIES: Levaquin, Lipitor, and Pravachol.   HOME MEDICATIONS:  1. K-Dur 10 mEq oral daily.  2. Lasix 20 mg daily.  3. Celexa 20 mg daily.  4. Xanax 0.5 mg by mouth 4 times a day as needed for anxiety.  5. Lantus 25 units sub-Q daily.  6. Simvastatin 40 mg daily.  7. Diovan 320 mg daily.  8. Vitamin B12 1000 mcg monthly.  9. Aspirin 81 mg daily.  10. Tylenol 325 mg 2 tablets by mouth b.i.d.  11. Glucophage 500 mg b.i.d.  12. Coreg 6.25 mg b.i.d.  13. Trazodone 50 mg at bedtime.  14. Humalog sliding scale.   FAMILY HISTORY: Significant for coronary artery disease.   SOCIAL HISTORY: No history of smoking or drinking. Has been living at [facility] for the last three months.   REVIEW OF SYSTEMS: Unfortunately secondary to the patient's poor mentation cannot give any reliable review of systems.   PHYSICAL EXAMINATION:   VITAL SIGNS: Temperature 101.3, pulse 89, respiratory rate 22, blood pressure 140/80, saturating 94% on 3 liters nasal cannula.   GENERAL: Frail elderly female who is comfortable in no apparent distress.   HEENT: Head normocephalic. Pupils equal, reactive to light. Pink conjunctivae. Anicteric sclerae. Dry oral mucosa.   NECK: Supple. No thyromegaly. No JVD.   CHEST: Had fair air entry bilaterally with left side rales and bibasilar crackles.   CARDIOVASCULAR: S1, S2  heard. No rubs, murmur, or gallops.   ABDOMEN: Soft, nontender, nondistended. Bowel sounds present.   EXTREMITIES: +1 to 2 bilateral edema.   PSYCHIATRIC: Unable to evaluate secondary to dementia and altered mental status.   NEUROLOGIC: Unable to evaluate secondary to the patient's noncompliance and altered mental status.   SKIN: Warm and dry. Normal skin turgor. Feeling feverish.   PERTINENT LABS: Glucose 168. BNP 2920. BUN 31, creatinine 1.02, sodium  140, potassium 4.5, chloride 105, CO2 29. Troponin less than 0.02. White blood cells 15, hemoglobin 10.2, hematocrit 30.4, platelets 210.   Chest x-ray showing left side infiltrate and right lower lung haziness, unclear if it's infiltrate versus volume overload.   ASSESSMENT AND PLAN:  1. Sepsis. The patient is febrile at 101.3 with leukocytosis of 15,000, initially tachypneic at 28. This is most likely related to pneumonia. Will send urinalysis to rule out any urinary tract infection.  2. Acute respiratory failure. This appears to be multifactorial at this point related to her pneumonia as well as possible volume overload from CHF as the patient has some congestion on the chest x-ray with lower extremity edema and elevated BNP.  3. Pneumonia. Blood cultures were sent. The patient will be started on Rocephin and Zithromax. As well will start on Mucinex and albuterol p.r.n.  4. Congestive heart failure. The patient appears to be clinically dehydrated but at the same time has some signs of volume overload. Will hold the patient's Lasix. Will hydrate gently as she is septic. Will give a total of 500 mL of normal saline and will re-evaluate after and decide upon necessity for fluid versus diuresis.  5. Hypertension. Will continue home medication, Diovan at a lower dose from 320 to 160, and will continue with Coreg 6.25 mg p.o. b.i.d.  6. Diabetes mellitus. Will hold Glucophage. Will start Lantus at a lower dose. Will decrease from 25 to 10 units sub-Q daily as p.o. intake is unpredictable and will have her on insulin sliding scale before meals.  7. Hyperlipidemia. Will continue on statin.  8. Coronary artery disease. The patient is on aspirin, beta-blocker, and ACE inhibitor.  9. Chronic obstructive pulmonary disease. The patient does not have any active wheezing. Does not appear to be in COPD exacerbation. Will continue her nebulizers and oxygen.  10. DVT prophylaxis. Sub-Q heparin.  11. GI prophylaxis.  Protonix.   CODE STATUS: DO NOT RESUSCITATE/DO NOT INTUBATE.   TOTAL TIME SPENT ON ADMISSION AND PATIENT CARE: 55 minutes.   ____________________________ Starleen Armsawood S. Desa Rech, MD dse:drc D: 07/15/2012 07:58:23 ET T: 07/15/2012 08:27:44 ET JOB#: [id number]  cc: Starleen Armsawood S. Ewan Grau, MD, <Dictator> Karie Schwalbeichard I. Letvak, MD Ambrosio Reuter Teena IraniS Jayd Cadieux MD ELECTRONICALLY SIGNED 08/03/2012 0:15

## 2015-03-04 NOTE — Consult Note (Signed)
PATIENT NAME:  Lisa Horn, Lisa Horn MR#:  [id number] DATE OF BIRTH:  1920/02/18  INTERNAL MEDICINE CONSULTATION REPORT  DATE OF CONSULTATION:  03/24/2013  PRIMARY CARE PHYSICIAN: Francia Greavesheryl Jeffries, MD.   ADMITTING MD:  Dr. Rosita KeaMenz.  CONSULTING PHYSICIAN:  Ramonita LabAruna Ivar Domangue, MD  REASON FOR MEDICAL CONSULT: Preoperative clearance.   HISTORY OF PRESENT ILLNESS: The patient is a 81 year old Caucasian female who lives at the [facility] regarding dementia. Was brought into the ER after she sustained a fall when getting up out of bed and had a hip fracture on the right side. The patient was seen and admitted by Dr. Rosita KeaMenz, and [facility] is consulted regarding medical management and preop clearance.   The patient is demented and could not give any history. According to patient's daughter, who is at bedside, she ambulates up to some extent and sometimes she uses a wheelchair as well. She needs feeding assistance.   Her CODE STATUS IS DO NOT RESUSCITATE.   The patient sees Gwen PoundsKowalski regarding her heart conditions, coronary artery disease, and congestive heart failure, and had last visit a few months ago, which was normal. The patient recently did not have any chest pain or shortness of breath, as reported by the patient's daughter. The patient was in pain and she has received her pain medication, and became nauseous and Zofran was given.   PAST MEDICAL HISTORY: Diabetes mellitus type 2, coronary artery disease status post CABG, congestive heart failure, history of pulmonary embolism after bypass surgery, currently not on any Coumadin, hypertension, history of MI in the year 1998, COPD, osteoarthritis, dementia.   Currently residing [facility] in the locked unit.   History of anemia and thrombocytopenia, requiring splenectomy, hyperlipidemia.   PAST SURGICAL HISTORY: CABG, left knee replacement, cholecystectomy, splenectomy, partial hysterectomy.    ALLERGIES: The patient is allergic to [employer]QUIN, LIPITOR and PRAVACHOL, BAYCOL.   HOME MEDICATIONS: Xanax 0.5 mg p.o. 4 times a day, trazodone 50 mg, half-tablet once daily, simvastatin 40 mg once daily, miconazole 2% topical ointment topically to affected area,  Lasix 20 mg once daily, Lantus 20 units subcutaneous once daily, K-Dur 10 mEq once daily, Humalog insulin, sliding-scale as needed for hyperglycemia, Glucophage 100 mg 2 times a day, Diovan 320 mg once daily, cyanocobalamin 1000 mcg daily, Coreg 6.25 mg 2 times a day, Celexa 20 mg once daily, Ceftin 500 mg 2 times a day, aspirin 81 mg once daily, Tylenol  325 mg, 2 tablets times a day.   FAMILY HISTORY: Coronary artery disease runs in her family.   PSYCHOSOCIAL HISTORY: The patient is currently residing in [facility] in the locked unit, regarding her dementia as reported by the patient's daughter. No history of smoking, alcohol or drugs.   REVIEW OF SYSTEMS: Unobtainable, as the patient is with dementia.   PHYSICAL EXAMINATION: VITAL SIGNS: Temperature 98.1, pulse 76, respirations 20, blood pressure 134/84, pulse ox 99% on 2 liters.  GENERAL APPEARANCE: The patient is uncomfortable from hip pain. Moderately built and thin.  HEENT: Normocephalic. No head trauma. Pupils are equally reactive to light and accommodation. No scleral icterus. Extraocular movements are intact. No sinus tenderness. No postnasal drip. No pharyngeal exudates.  NECK: Supple. No JVD. No thyromegaly. No lymphadenopathy.  LUNGS: Clear to auscultation bilaterally. No accessory muscle use. No anterior chest wall tenderness on palpation.  CARDIOVASCULAR: S1, S2 normal. Regular rate and rhythm. Positive murmur.  GASTROINTESTINAL: Soft. Bowel sounds are positive in all  4 quadrants. Nontender, nondistended. No masses felt. No hepatosplenomegaly  NEUROLOGIC: Awake and alert, but demented. Oriented to person only.  SKIN: Warm to touch. Normal  turgor. No rashes. No lesions.  MUSCULOSKELETAL: Right hip is immobilized regarding hip fracture. No edema. No cyanosis. No clubbing of the extremities. No joint effusions or tenderness other than the right hip, which is status post fracture.   LABS AND IMAGING STUDIES: A 12-lead EKG has revealed normal sinus rhythm, normal P-R and QRS intervals. No ST-T wave changes.   WBC: 16.3, hemoglobin 11.8, hematocrit 35.0, platelets 290, MCV at 103, MCH 34.8. Glucose 175, BUN 38, creatinine 1.03, sodium 139, potassium 4.1, chloride 104, CO2 30. Anion gap is 5, GFR is 39, serum osmolality 291, calcium 10.3.   CHEST X-RAY: No acute findings.   ASSESSMENT AND PLAN:  1.  Chronic COPD: The patient is stable at this time. Not short of breath. Will provide neb treatments as needed.  2.  Diabetes mellitus, type 2: The patient will be on insulin sliding scale. Will hold metformin.  3.  History of coronary artery disease/congestive heart failure: The patient is not currently fluid-overloaded. Will continue the patient's home medications, beta blocker, and also baby aspirin as it reduces the perioperative mortality rate.  4.  Anemia and thrombocytopenia, status post splenectomy: Hemoglobin and platelet count are stable. Will monitor closely.  5.  History of pulmonary embolism, status post treatment. Not currently not on any anticoagulation.  6.  Right hip fracture: Pain management per ortho. We will provide her GI prophylaxis and DVT prophylaxis with TEDS. The patient's daughter would like to discuss with Dr. Rosita Kea regarding pros and cons of hip surgery, as the patient is demented, elderly, and ambulates a little only. THE PATIENT IS DNR.  Diagnosis and plan of care was discussed in detail with the patient. Medically optimized for noncardiac surgery, but the patient's daughter prefers talking to Dr. Rosita Kea prior to making any decisions regarding hip surgery.   Total times spent on consult is 50 minutes.   Thank you,  Dr. Rosita Kea, for allowing the [facility] to evaluate and participate with this patient.   ____________________________ Ramonita Lab, MD ag:dm D: 03/24/2013 06:46:58 ET T: 03/24/2013 09:39:19 ET JOB#: [id number]  cc: Ramonita Lab, MD, <Dictator> Ramonita Lab MD ELECTRONICALLY SIGNED 04/02/2013 0:29

## 2015-03-04 NOTE — H&P (Signed)
PATIENT NAME:  Lisa Horn, Lisa Horn MR#:  [id number] DATE OF BIRTH:  10/21/1920  DATE OF ADMISSION:  03/24/2013  CHIEF COMPLAINT: Right hip pain.   HISTORY OF PRESENT ILLNESS: The patient is a 81 year old resident of [place].  she suffers from dementia.  She has been a minimal ambulator although she does push herself around in a wheelchair and tries to walk fast. She suffered a fall last night, was unable to bear weight. She was brought to the Emergency Room where she was found to have an intertrochanteric hip fracture based on x-rays and is being admitted for this.  PAST MEDICAL HISTORY:  Her past history is obtained from her family and includes diabetes type 2, coronary artery disease, congestive heart failure, history of PE after bypass surgery,  hypertension, prior MI, COPD, arthritis, anemia and thrombocytopenia.   PAST SURGICAL HISTORY:  Coronary artery bypass, knee replacements, splenectomy, hysterectomy, cholecystectomy.   ALLERGIES: 1.  LEVAQUIN. 2.  LIPITOR. 3.  PRAVACHOL.  FAMILY HISTORY: Positive for heart disease.   SOCIAL HISTORY: She is a nondrinker, nonsmoker.  REVIEW OF SYSTEMS:   Unobtainable.   MEDICATIONS ON ADMISSION: Trazodone 25 mg at night. Simvastatin 40 mg at night.  Miconazole 2% topical cream b.i.d. to affected area. Lasix 20 mg daily. Lantus insulin 25 units daily. K-Dur 10 mEq daily.  Sliding scale Humalog.  Glucophage 500 b.i.d. Diovan 320 mg daily.   B12 1000 mcg IM once a month. Coreg 6.25 b.i.d. Celexa 20 mg daily. Aspirin 81 mg daily. Tylenol 325 mg 2 tablets b.i.d. as needed for pain.   PHYSICAL EXAMINATION: GENERAL: Well-developed, well-nourished white female who appears in moderate distress secondary to right hip pain.  HEENT: Remarkable for teeth in fair condition.  Normocephalic, atraumatic. LUNGS: Clear.  HEART: Regular rate and rhythm.  ABDOMEN: Soft, nontender.  EXTREMITIES: Remarkable for the right leg being shortened in the traction boot but  there is no rotational deformity. Skin is intact around the hip. She is able to flex and extend the toes and has palpable dorsalis pedis pulse. She has a mild edema in both lower extremities.   RADIOLOGIC DATA:  X-rays reveal a comminuted intertrochanteric fracture with displacement of the lesser trochanter. X-ray impression is unstable intertrochanteric hip fracture.   CLINICAL IMPRESSION:  Unstable intertrochanteric  hip fracture in a demented patient who has been minimally ambulatory.   RECOMMENDATIONS: Open reduction and internal fixation with goals of pain relief and mobilization for skin care.  Discussed with her family that she may not ambulate after this, but hopefully we get adequate pain relief. Discussed that this is significant procedure and with her medical problems she is at significant risk for complications. They understands this and wish to proceed with surgery.  We will plan on doing that later today.  Additionally, preoperative medical evaluation has been obtained.    ____________________________ Leitha SchullerMichael J. Roanne Haye, MD mjm:ct D: 03/24/2013 06:59:10 ET T: 03/24/2013 08:31:21 ET JOB#: [id number]  cc: Leitha SchullerMichael J. Imajean Mcdermid, MD, <Dictator> Leitha SchullerMICHAEL J Zeenat Jeanbaptiste MD ELECTRONICALLY SIGNED 03/24/2013 9:07

## 2015-03-04 NOTE — Op Note (Signed)
PATIENT NAME:  Lisa Horn, Lisa Horn MR#:  [id number] DATE OF BIRTH:  [birth year]  DATE OF PROCEDURE:  03/24/2013  PREOPERATIVE DIAGNOSIS: Right comminuted intertrochanteric hip fracture.   POSTOPERATIVE DIAGNOSIS: Right comminuted intertrochanteric hip fracture.  PROCEDURE: Open reduction internal fixation right proximal femur with intramedullary device.   SURGEON: Leitha SchullerMichael J. Danaye Sobh, MD  ANESTHESIA:  Spinal.    DESCRIPTION OF PROCEDURE: The patient was brought to the operating room and after adequate anesthesia was obtained, the patient was transferred to the fracture table; the left leg was in the well leg holder, the right leg in the traction boot with traction applied. AP and lateral images showed significant comminution, some posterior sag at the fracture site. The hip was prepped and draped using the barrier drape method. After appropriate patient identification and timeout procedures were completed, an incision was made proximal to the greater trochanter and a guidewire inserted to the tip of the trochanter and proximal reaming carried out. The guidewire was inserted down the canal and measuring off of this the appropriate size rod was obtained, a 9 x 340 mm long Affixus right rod, 130 degrees.  This was inserted to the appropriate depth and a lag screw was inserted with the scrub tech holding the posterior thigh superiorly and a lag screw inserted through a small lateral incision using the drill guide. A guidewire was inserted into a center-center position on the head, measured, drilled, and I then placed the 150 mm lag screw. This was then tightened proximally and then a quarter turn loosening to allow for impaction.  Traction was released and the compression device applied to compress the fracture, and this gave good compression at the fracture site with near anatomic alignment. Next, the insertion handle was removed. The leg was abducted easier access for a C-arm, and the distal interlocking screw  was placed using a freehand technique in the oblique screw hole to give protection against any subsequent malrotation.  A 44 mm screw was inserted, well visualized on both AP and lateral imaging and penetrated both cortices without being too long. The wounds were then copiously irrigated. The wounds were closed with #1 Vicryl deep, 2-0 Vicryl subcutaneously and skin staples.   ESTIMATED BLOOD LOSS: 200.  COMPLICATIONS: None.   SPECIMEN: None.   IMPLANTS: A 130-degree right long Affixus 9 x 340 mm with a 150 mm lag screw and a 44 mm 5.0 cortical.   CONDITION TO RECOVERY ROOM: Stable.    ____________________________ Leitha SchullerMichael J. Joylyn Duggin, MD mjm:cb D: 03/24/2013 21:38:13 ET T: 03/24/2013 22:16:08 ET JOB#: [id number]  cc: Leitha SchullerMichael J. Angelynn Lemus, MD, <Dictator> Leitha SchullerMICHAEL J Lamees Gable MD ELECTRONICALLY SIGNED 03/24/2013 22:44

## 2015-03-04 NOTE — Discharge Summary (Signed)
PATIENT NAME:  Lisa Horn, Asia E MR#:  [id number] DATE OF BIRTH:  1920/05/04  DATE OF ADMISSION:  03/24/2013 DATE OF DISCHARGE:    ADMITTING DIAGNOSIS: Right hip fracture.  DISCHARGE DIAGNOSIS: Right hip fracture.  OPERATION: On 03/24/2013, the patient had a right comminuted intertrochanteric hip fracture ORIF done by Dr. Rosita KeaMenz.   ANESTHESIA: Spinal.   ESTIMATED BLOOD LOSS: 200 mL.   COMPLICATIONS: None.   IMPLANTS USED: 130 degree right long gamma nail 9 x 340 mm with a 150 mm lag screw and a 44 mm 5.0 cortical screw.   CONDITION: Stable. The patient was brought to the recovery room and then brought down to the orthopedic floor.   HISTORY: The patient is a 81 year old female, who presented for right hip pain. [place] and [place]. The patient fell and could not ambulate. The patient was brought to the Emergency Room where x-rays revealed an intertrochanteric hip fracture.   PHYSICAL EXAMINATION:  GENERAL: Mildly nourished female with moderate distress secondary to hip pain.  LUNGS: Clear.  HEART: Regular rate and rhythm.  MUSCULOSKELETAL: In regard to the right lower extremity, the patient has shortening and external rotation. The patient is in traction. The patient's skin is intact. The patient has mild edema involving both lower extremities. The patient was stabilized and brought to surgery that same day.    HOSPITAL COURSE: After surgery on the Mar 24, 2013, the patient was brought to the orthopedic floor. On postoperative day 1, the patient had a hemoglobin of 8.2, which dropped down to 7.9. On postoperative day 2, the patient received 1 unit of transfused blood, which brought her hemoglobin up to 8.8 that evening and then the following day, on postoperative day 3, the hemoglobin was to 8.4 and remained stable there. The patient's vitals were stable. The patient still had dementia, although she did work with some physical therapy with range of  motion.   CONDITION AT DISCHARGE: Stable.   DISCHARGE INSTRUCTIONS: [facility] for staple removal. The patient will do physical therapy gait training and range of motion activities and strength. The patient is to do weight-bear as tolerated on the right leg. The patient will use knee-high TED hose on both legs, to be removed 1 hour every 8 hour shift. The patient will elevate her heels off the bed and be encouraged to do coughing and deep breathing.  Diet is regular. The patient will have a dressing change as needed and try to leave is on and keep it clean. [place] will call the clinic if there is any bright red bleeding or fever greater than 101.5, any calf pain or bowel or bladder difficulty.   DISCHARGE MEDICATIONS: Resume her same medications that she was admitted with, just add Vicodin 1 tablet every 4 hours as needed for pain, ferrous fumarate 1 tablet p.o. twice a day,  as well as bisacodyl 10 mg suppository at bedtime or as needed for constipation.   ____________________________ Shela CommonsJ. Dedra Skeensodd Lawton Dollinger, [place] jtm:aw D: 03/27/2013 08:11:05 ET T: 03/27/2013 08:37:48 ET JOB#: [id number]  cc: J. Dedra Skeensodd Aujanae Mccullum, [place], <Dictator> J Kaelynn Igo [facility] Center[facility]UN[facility] PA ELECTRONICALLY SIGNED 04/03/2013 7:19
